# Patient Record
Sex: Female | Born: 1960 | Race: White | Hispanic: No | Smoking: Former smoker
Health system: Southern US, Community
[De-identification: ages and names within clinical notes are randomized; demographics above are authoritative.]

## PROBLEM LIST (undated history)

## (undated) DIAGNOSIS — D497 Neoplasm of unspecified behavior of endocrine glands and other parts of nervous system: Secondary | ICD-10-CM

## (undated) DIAGNOSIS — E079 Disorder of thyroid, unspecified: Secondary | ICD-10-CM

## (undated) DIAGNOSIS — I10 Essential (primary) hypertension: Secondary | ICD-10-CM

## (undated) DIAGNOSIS — E119 Type 2 diabetes mellitus without complications: Secondary | ICD-10-CM

## (undated) DIAGNOSIS — Q251 Coarctation of aorta: Secondary | ICD-10-CM

## (undated) HISTORY — PX: AORTA SURGERY: SHX548

## (undated) HISTORY — PX: ABDOMINAL HYSTERECTOMY: SHX81

## (undated) HISTORY — PX: CHOLECYSTECTOMY: SHX55

---

## 1998-03-04 ENCOUNTER — Ambulatory Visit (HOSPITAL_COMMUNITY): Admission: RE | Admit: 1998-03-04 | Discharge: 1998-03-04 | Payer: Self-pay | Admitting: Gastroenterology

## 1999-01-21 ENCOUNTER — Other Ambulatory Visit: Admission: RE | Admit: 1999-01-21 | Discharge: 1999-01-21 | Payer: Self-pay | Admitting: Gynecology

## 1999-03-17 ENCOUNTER — Encounter: Admission: RE | Admit: 1999-03-17 | Discharge: 1999-06-15 | Payer: Self-pay | Admitting: Anesthesiology

## 1999-03-24 ENCOUNTER — Encounter: Admission: RE | Admit: 1999-03-24 | Discharge: 1999-06-22 | Payer: Self-pay | Admitting: Neurology

## 1999-05-21 ENCOUNTER — Emergency Department (HOSPITAL_COMMUNITY): Admission: EM | Admit: 1999-05-21 | Discharge: 1999-05-21 | Payer: Self-pay | Admitting: Emergency Medicine

## 1999-05-21 ENCOUNTER — Encounter: Payer: Self-pay | Admitting: Emergency Medicine

## 1999-06-30 ENCOUNTER — Encounter: Admission: RE | Admit: 1999-06-30 | Discharge: 1999-09-28 | Payer: Self-pay | Admitting: Neurology

## 1999-07-22 ENCOUNTER — Ambulatory Visit: Admission: RE | Admit: 1999-07-22 | Discharge: 1999-07-22 | Payer: Self-pay | Admitting: Cardiovascular Disease

## 1999-10-07 ENCOUNTER — Encounter: Payer: Self-pay | Admitting: General Surgery

## 1999-10-08 ENCOUNTER — Encounter: Payer: Self-pay | Admitting: General Surgery

## 1999-10-08 ENCOUNTER — Ambulatory Visit (HOSPITAL_COMMUNITY): Admission: RE | Admit: 1999-10-08 | Discharge: 1999-10-09 | Payer: Self-pay | Admitting: General Surgery

## 2000-12-07 ENCOUNTER — Encounter: Admission: RE | Admit: 2000-12-07 | Discharge: 2000-12-26 | Payer: Self-pay | Admitting: Specialist

## 2006-01-06 ENCOUNTER — Encounter: Admission: RE | Admit: 2006-01-06 | Discharge: 2006-01-06 | Payer: Self-pay | Admitting: Internal Medicine

## 2006-10-30 ENCOUNTER — Encounter: Admission: RE | Admit: 2006-10-30 | Discharge: 2006-10-30 | Payer: Self-pay | Admitting: Internal Medicine

## 2010-12-18 ENCOUNTER — Encounter: Payer: Self-pay | Admitting: Internal Medicine

## 2012-02-10 LAB — HM DEXA SCAN

## 2012-02-14 ENCOUNTER — Encounter: Payer: Self-pay | Admitting: Family

## 2013-02-04 ENCOUNTER — Emergency Department (HOSPITAL_BASED_OUTPATIENT_CLINIC_OR_DEPARTMENT_OTHER)
Admission: EM | Admit: 2013-02-04 | Discharge: 2013-02-04 | Disposition: A | Discharge status: Home / Self Care | Payer: Medicare Other | Attending: Emergency Medicine | Admitting: Emergency Medicine

## 2013-02-04 ENCOUNTER — Encounter (HOSPITAL_BASED_OUTPATIENT_CLINIC_OR_DEPARTMENT_OTHER): Payer: Self-pay

## 2013-02-04 DIAGNOSIS — Z79899 Other long term (current) drug therapy: Secondary | ICD-10-CM | POA: Insufficient documentation

## 2013-02-04 DIAGNOSIS — Z862 Personal history of diseases of the blood and blood-forming organs and certain disorders involving the immune mechanism: Secondary | ICD-10-CM | POA: Insufficient documentation

## 2013-02-04 DIAGNOSIS — Z87891 Personal history of nicotine dependence: Secondary | ICD-10-CM | POA: Insufficient documentation

## 2013-02-04 DIAGNOSIS — Z8639 Personal history of other endocrine, nutritional and metabolic disease: Secondary | ICD-10-CM | POA: Insufficient documentation

## 2013-02-04 DIAGNOSIS — J9801 Acute bronchospasm: Secondary | ICD-10-CM | POA: Insufficient documentation

## 2013-02-04 DIAGNOSIS — Z8679 Personal history of other diseases of the circulatory system: Secondary | ICD-10-CM | POA: Insufficient documentation

## 2013-02-04 DIAGNOSIS — I10 Essential (primary) hypertension: Secondary | ICD-10-CM | POA: Insufficient documentation

## 2013-02-04 DIAGNOSIS — E119 Type 2 diabetes mellitus without complications: Secondary | ICD-10-CM | POA: Insufficient documentation

## 2013-02-04 DIAGNOSIS — IMO0002 Reserved for concepts with insufficient information to code with codable children: Secondary | ICD-10-CM | POA: Insufficient documentation

## 2013-02-04 DIAGNOSIS — J9809 Other diseases of bronchus, not elsewhere classified: Secondary | ICD-10-CM

## 2013-02-04 HISTORY — DX: Type 2 diabetes mellitus without complications: E11.9

## 2013-02-04 HISTORY — DX: Essential (primary) hypertension: I10

## 2013-02-04 HISTORY — DX: Disorder of thyroid, unspecified: E07.9

## 2013-02-04 HISTORY — DX: Neoplasm of unspecified behavior of endocrine glands and other parts of nervous system: D49.7

## 2013-02-04 HISTORY — DX: Coarctation of aorta: Q25.1

## 2013-02-04 MED ORDER — ALBUTEROL SULFATE HFA 108 (90 BASE) MCG/ACT IN AERS: 2.0000 | INHALATION_SPRAY | RESPIRATORY_TRACT | Status: DC | PRN

## 2013-02-04 MED ORDER — ALBUTEROL SULFATE (5 MG/ML) 0.5% IN NEBU
INHALATION_SOLUTION | RESPIRATORY_TRACT | Status: AC
  Administered 2013-02-04: 5 mg
  Filled 2013-02-04: qty 1

## 2013-02-04 MED ORDER — PREDNISONE 20 MG PO TABS: ORAL_TABLET | ORAL | Status: DC

## 2013-02-04 MED ORDER — PANTOPRAZOLE SODIUM 20 MG PO TBEC: 20.0000 mg | DELAYED_RELEASE_TABLET | Freq: Every day | ORAL | Status: DC

## 2013-02-04 MED ORDER — ALBUTEROL SULFATE (5 MG/ML) 0.5% IN NEBU
5.0000 mg | INHALATION_SOLUTION | Freq: Once | RESPIRATORY_TRACT | Status: AC
  Administered 2013-02-04: 5 mg via RESPIRATORY_TRACT
  Filled 2013-02-04: qty 0.5

## 2013-02-04 MED ORDER — IPRATROPIUM BROMIDE 0.02 % IN SOLN
RESPIRATORY_TRACT | Status: AC
  Administered 2013-02-04: 0.5 mg
  Filled 2013-02-04: qty 2.5

## 2013-02-04 MED ORDER — PREDNISONE 50 MG PO TABS
60.0000 mg | ORAL_TABLET | Freq: Once | ORAL | Status: AC
  Administered 2013-02-04: 60 mg via ORAL
  Filled 2013-02-04: qty 1

## 2013-02-04 NOTE — ED Provider Notes (Signed)
History  This chart was scribed for Hurman Horn, MD by Ardeen Jourdain, ED Scribe. This patient was seen in room MH07/MH07 and the patient's care was started at 1816.  CSN: [id number]  Arrival date & time 02/04/13  1704   First MD Initiated Contact with Patient 02/04/13 1816      Chief Complaint  Patient presents with  . Shortness of Breath     The history is provided by the patient. No language interpreter was used.    Monica Peterson is a 52 y.o. female with a h/o aorta coarctation, HTN and DM who presents to the Emergency Department complaining of gradually worsening, constant SOB that began 1.5 months ago with associated productive cough and post tussive emesis. She states she has been prescribed 3 rounds of steroids, 3 rounds of antibiotics and an inhaler continuously with no relief. She states she had a negative pertussis when she was seen at [facility] last week. Pt denies fever, neck pain, confusion, hallucinations, CP, abdominal pain, nausea, diarrhea, back pain, HA, weakness, numbness and rash as associated symptoms. Pt states she is not an everyday smoker.     Past Medical History  Diagnosis Date  . Aorta coarctation   . Thyroid disease   . Diabetes mellitus without complication   . Pituitary tumor   . Hypertension     Past Surgical History  Procedure Laterality Date  . Aorta surgery    . Abdominal hysterectomy    . Cholecystectomy    . Cesarean section      No family history on file.  History  Substance Use Topics  . Smoking status: Former Games developer  . Smokeless tobacco: Not on file  . Alcohol Use: Yes   No OB history available.   Review of Systems  10 Systems reviewed and are negative for acute change except as noted in the HPI.  Allergies  Codeine; Topamax; Celexa; Serzone; and Trazodone and nefazodone  Home Medications   Current Outpatient Rx  Name  Route  Sig  Dispense  Refill  . acetaminophen-codeine (TYLENOL #3) 300-30 MG per tablet   Oral   Take 1  tablet by mouth every 4 (four) hours as needed for pain.         Marland Kitchen albuterol (PROVENTIL HFA;VENTOLIN HFA) 108 (90 BASE) MCG/ACT inhaler   Inhalation   Inhale 2 puffs into the lungs every 6 (six) hours as needed for wheezing.         Marland Kitchen ALPRAZolam (XANAX) 0.5 MG tablet   Oral   Take 0.5 mg by mouth at bedtime as needed for sleep.         Marland Kitchen azithromycin (ZITHROMAX Z-PAK) 250 MG tablet   Oral   Take 250 mg by mouth daily.         . cabergoline (DOSTINEX) 0.5 MG tablet   Oral   Take 0.5 mg by mouth 2 (two) times a week.         . cyclobenzaprine (FLEXERIL) 10 MG tablet   Oral   Take 10 mg by mouth 3 (three) times daily as needed for muscle spasms.         . fluticasone (FLONASE) 50 MCG/ACT nasal spray   Nasal   Place 2 sprays into the nose daily.         . furosemide (LASIX) 40 MG tablet   Oral   Take 40 mg by mouth daily.         Marland Kitchen guaiFENesin (ROBITUSSIN) 100 MG/5ML liquid  Oral   Take 200 mg by mouth 4 (four) times daily as needed for cough (Pt states only taken one dose today.).         Marland Kitchen ibuprofen (ADVIL,MOTRIN) 800 MG tablet   Oral   Take 800 mg by mouth every 8 (eight) hours as needed for pain.         Marland Kitchen levothyroxine (SYNTHROID, LEVOTHROID) 88 MCG tablet   Oral   Take 88 mcg by mouth daily.         . metoprolol succinate (TOPROL-XL) 25 MG 24 hr tablet   Oral   Take 25 mg by mouth daily.         . Olopatadine HCl (PATADAY) 0.2 % SOLN   Ophthalmic   Apply to eye.         . promethazine (PHENERGAN) 12.5 MG tablet   Oral   Take 12.5 mg by mouth every 6 (six) hours as needed for nausea.         . ranitidine (ZANTAC) 150 MG tablet   Oral   Take 300 mg by mouth 2 (two) times daily.         . traMADol (ULTRAM) 50 MG tablet   Oral   Take 50 mg by mouth every 6 (six) hours as needed for pain.         Marland Kitchen albuterol (PROVENTIL HFA;VENTOLIN HFA) 108 (90 BASE) MCG/ACT inhaler   Inhalation   Inhale 2 puffs into the lungs every 2 (two)  hours as needed for wheezing or shortness of breath (cough).   1 Inhaler   0   . pantoprazole (PROTONIX) 20 MG tablet   Oral   Take 1 tablet (20 mg total) by mouth daily.   30 tablet   0   . predniSONE (DELTASONE) 20 MG tablet      3 tabs po daily x 3 days, then 2 tabs x 3 days, then 1.5 tabs x 3 days, then 1 tab x 3 days, then 0.5 tabs x 3 days   27 tablet   0     Triage Vitals: BP 157/104  Pulse 90  Temp(Src) 98.2 F (36.8 C) (Oral)  Resp 20  SpO2 97%  Physical Exam  Nursing note and vitals reviewed. Constitutional: She is oriented to person, place, and time. She appears well-developed and well-nourished. No distress.  Awake, alert, nontoxic appearance. Obese  HENT:  Head: Normocephalic and atraumatic.  Eyes: Right eye exhibits no discharge. Left eye exhibits no discharge.  Neck: Neck supple.  Cardiovascular: Normal rate, regular rhythm and normal heart sounds.  Exam reveals no gallop and no friction rub.   No murmur heard. Pulmonary/Chest: Effort normal. No respiratory distress. She has wheezes. She has no rales. She exhibits tenderness.  Chest wall mildly tender, diffuse wheezes bilaterally   Abdominal: Soft. Bowel sounds are normal. There is no tenderness. There is no rebound.  Musculoskeletal: She exhibits no tenderness.  Baseline ROM, no obvious new focal weakness.  Neurological: She is alert and oriented to person, place, and time.  Mental status and motor strength appears baseline for patient and situation.  Skin: Skin is warm and dry. No rash noted. She is not diaphoretic.  Psychiatric: She has a normal mood and affect. Her behavior is normal.    ED Course  Procedures (including critical care time)  DIAGNOSTIC STUDIES: Oxygen Saturation is 97% on room air, normal by my interpretation.    COORDINATION OF CARE:  6:24 PM: Patient / Family / Caregiver  informed of clinical course, understand medical decision-making process, and agree with plan.   Labs  Reviewed - No data to display No results found.   1. Recurrent bronchospasm       MDM  I doubt any other EMC precluding discharge at this time including, but not necessarily limited to the following:sepsis.  I personally performed the services described in this documentation, which was scribed in my presence. The recorded information has been reviewed and is accurate.     Hurman Horn, MD 02/06/13 2312

## 2013-02-04 NOTE — ED Notes (Signed)
C/o sob x 1 .5 months-prod cough x 2 weeks-was seen at [facility] last week with negative pertussis-started on abx

## 2013-02-18 ENCOUNTER — Ambulatory Visit (INDEPENDENT_AMBULATORY_CARE_PROVIDER_SITE_OTHER): Payer: Medicare Other | Admitting: Internal Medicine

## 2013-02-18 ENCOUNTER — Encounter: Payer: Self-pay | Admitting: Internal Medicine

## 2013-02-18 VITALS — BP 160/100 | HR 90 | Temp 97.1°F | Ht 62.0 in | Wt 251.2 lb

## 2013-02-18 DIAGNOSIS — R05 Cough: Secondary | ICD-10-CM

## 2013-02-18 DIAGNOSIS — R059 Cough, unspecified: Secondary | ICD-10-CM

## 2013-02-18 MED ORDER — PREDNISONE (PAK) 10 MG PO TABS: ORAL_TABLET | ORAL | Status: DC

## 2013-02-18 MED ORDER — PANTOPRAZOLE SODIUM 40 MG PO TBEC: 40.0000 mg | DELAYED_RELEASE_TABLET | Freq: Every day | ORAL | Status: DC

## 2013-02-18 MED ORDER — RANITIDINE HCL 150 MG PO TABS: ORAL_TABLET | ORAL | Status: AC

## 2013-02-18 NOTE — Patient Instructions (Addendum)
The key to effective treatment for your cough is eliminating the non-stop cycle of cough you're stuck in long enough to let your airway heal completely and then see if there is anything still making you cough once you stop the cough suppression, but this should take no more than 5 days to figuure out  First take delsym two tsp every 12 hours and supplement if needed with  tramadol 50 mg up to 2 every 4 hours to suppress the urge to cough. Swallowing water or using ice chips/non mint and menthol containing candies (such as lifesavers or sugarless jolly ranchers) are also effective.  You should rest your voice and avoid activities that you know make you cough.  Once you have eliminated the cough for 3 straight days try reducing the tramadol first,  then the delsym as tolerated.    Try pantoprazole 40 Take 30-60 min before first meal of the day and Zantac 150 x 2  At  bedtime until cough is completely gone for at least a week without the need for cough suppression  I think of reflux for chronic cough like I do oxygen for fire (doesn't cause the fire but once you get the oxygen suppressed it usually goes away regardless of the exact cause).  GERD (REFLUX)  is an extremely common cause of respiratory symptoms, many times with no significant heartburn at all.    It can be treated with medication, but also with lifestyle changes including avoidance of late meals, excessive alcohol, smoking cessation, and avoid fatty foods, chocolate, peppermint, colas, red wine, and acidic juices such as orange juice.  NO MINT OR MENTHOL PRODUCTS SO NO COUGH DROPS  USE SUGARLESS CANDY INSTEAD (jolley ranchers or Stover's)  NO OIL BASED VITAMINS - use powdered substitutes.    Prednisone 10 mg take  4 each am x 2 days,   2 each am x 2 days,  1 each am x2days and stop

## 2013-02-18 NOTE — Progress Notes (Deleted)
  Subjective:    Patient ID: Monica Peterson, female    DOB: 1961/10/02, 52 y.o.   MRN: [id number]  HPI    Review of Systems  Constitutional: Negative for fever, chills and unexpected weight change.  HENT: Negative for ear pain, nosebleeds, congestion, sore throat, rhinorrhea, sneezing, trouble swallowing, dental problem, voice change, postnasal drip and sinus pressure.   Eyes: Negative for visual disturbance.  Respiratory: Negative for cough, choking and shortness of breath.   Cardiovascular: Negative for chest pain and leg swelling.  Gastrointestinal: Negative for vomiting, abdominal pain and diarrhea.  Genitourinary: Negative for difficulty urinating.  Musculoskeletal: Negative for arthralgias.  Skin: Negative for rash.  Neurological: Negative for tremors, syncope and headaches.  Hematological: Does not bruise/bleed easily.       Objective:   Physical Exam        Assessment & Plan:

## 2013-02-18 NOTE — Progress Notes (Signed)
  Subjective:    Patient ID: Monica Peterson, female    DOB: Oct 11, 1961   MRN: [id number]  HPI  51 yowf no significant history of smoking but onset pnds dx as seasonal  rhinitis 2012  then sinus surgery in [place] deviated septum then cough started late Jan 2014 assoc ? Sinus infection > persistent cough referred 02/18/2013 to pulmonary clinic.  02/18/2013 1st pulmonary ov cc s/p 5 ov's persistent cough > dry, hoarse, sob assoc with throat pain and fullness, prednisone helps the most but not the  last round,  proaire eases some.  protonix started 3/10 but takes p breakfast. Has been to [facility] ER / ct chest > neg  Mucus was green , no more  Sinus fill up toward end of day but discharge clear Sleeps on 2 pillows  Not sob unless coughing  No obvious daytime variabilty or assoc chronic  cp or chest tightness, subjective wheeze overt sinus or hb symptoms. No unusual exp hx or h/o childhood pna/ asthma or premature birth to her knowledge.   Sleeping ok without nocturnal  or early am exacerbation  of respiratory  c/o's or need for noct saba. Also denies any obvious fluctuation of symptoms with weather or environmental changes or other aggravating or alleviating factors except as outlined above   ROS  The following are not active complaints unless bolded sore throat, dysphagia, dental problems, itching, sneezing,  nasal congestion or excess/ purulent secretions, ear ache,   fever, chills, sweats, unintended wt loss, pleuritic or exertional cp, hemoptysis,  orthopnea pnd or leg swelling chronic dep minimal  presyncope, palpitations, heartburn, abdominal pain, anorexia, nausea, vomiting, diarrhea  or change in bowel or urinary habits, change in stools or urine, dysuria,hematuria,  rash, arthralgias, visual complaints, headache, numbness weakness or ataxia or problems with walking or coordination,  change in mood/affect or memory.       Review of Systems  HENT: Positive for ear pain, sore throat, trouble  swallowing and dental problem.   Respiratory: Positive for cough and shortness of breath.   Cardiovascular: Positive for leg swelling.  Musculoskeletal: Positive for joint swelling.  Psychiatric/Behavioral: The patient is nervous/anxious.        Objective:   Physical Exam  amb hoarse wf nad  HEENT: nl dentition, turbinates, and orophanx. Nl external ear canals without cough reflex   NECK :  without JVD/Nodes/TM/ nl carotid upstrokes bilaterally   LUNGS: no acc muscle use, clear to A and P bilaterally without cough on insp or exp maneuvers   CV:  RRR  no s3 or murmur or increase in P2, no edema   ABD:  soft and nontender with nl excursion in the supine position. No bruits or organomegaly, bowel sounds nl  MS:  warm without deformities, calf tenderness, cyanosis or clubbing  SKIN: warm and dry without lesions    NEURO:  alert, approp, no deficits  Ct report from [facility] neg PE          Assessment & Plan:

## 2013-02-20 DIAGNOSIS — R05 Cough: Secondary | ICD-10-CM | POA: Insufficient documentation

## 2013-02-20 DIAGNOSIS — R059 Cough, unspecified: Secondary | ICD-10-CM | POA: Insufficient documentation

## 2013-02-20 NOTE — Assessment & Plan Note (Signed)
The most common causes of chronic cough in immunocompetent adults include the following: upper airway cough syndrome (UACS), previously referred to as postnasal drip syndrome (PNDS), which is caused by variety of rhinosinus conditions; (2) asthma; (3) GERD; (4) chronic bronchitis from cigarette smoking or other inhaled environmental irritants; (5) nonasthmatic eosinophilic bronchitis; and (6) bronchiectasis.   These conditions, singly or in combination, have accounted for up to 94% of the causes of chronic cough in prospective studies.   Other conditions have constituted no >6% of the causes in prospective studies These have included bronchogenic carcinoma, chronic interstitial pneumonia, sarcoidosis, left ventricular failure, ACEI-induced cough, and aspiration from a condition associated with pharyngeal dysfunction.    Chronic cough is often simultaneously caused by more than one condition. A single cause has been found from 38 to 82% of the time, multiple causes from 18 to 62%. Multiply caused cough has been the result of three diseases up to 42% of the time.       Most likely this is  Classic Upper airway cough syndrome, so named because it's frequently impossible to sort out how much is  CR/sinusitis with freq throat clearing (which can be related to primary GERD)   vs  causing  secondary (" extra esophageal")  GERD from wide swings in gastric pressure that occur with throat clearing, often  promoting Etcheverry use of mint and menthol lozenges that reduce the lower esophageal sphincter tone and exacerbate the problem further in a cyclical fashion.   These are the same pts (now being labeled as having "irritable larynx syndrome" by some cough centers) who not infrequently have a history of having failed to tolerate ace inhibitors,  dry powder inhalers or biphosphonates or report having atypical reflux symptoms that don't respond to standard doses of PPI , and are easily confused as having aecopd or asthma  flares by even experienced allergists/ pulmonologists.   For now max rx for gerd/ cyclical cough then regroup  See instructions for specific recommendations which were reviewed directly with the patient who was given a copy with highlighter outlining the key components.

## 2013-02-22 ENCOUNTER — Institutional Professional Consult (permissible substitution): Payer: Medicare Other | Admitting: Internal Medicine

## 2013-03-04 ENCOUNTER — Telehealth: Payer: Self-pay | Admitting: Internal Medicine

## 2013-03-04 NOTE — Telephone Encounter (Signed)
Spoke with the pt and she is asking for an appt. She states her cough has returned. Appt made for Thursday at 10am.Deloss Amico [place], [place]

## 2013-03-07 ENCOUNTER — Ambulatory Visit (INDEPENDENT_AMBULATORY_CARE_PROVIDER_SITE_OTHER): Payer: Medicare Other | Admitting: Internal Medicine

## 2013-03-07 ENCOUNTER — Encounter: Payer: Self-pay | Admitting: Internal Medicine

## 2013-03-07 VITALS — BP 142/86 | HR 95 | Temp 98.2°F | Ht 61.0 in | Wt 254.4 lb

## 2013-03-07 DIAGNOSIS — R059 Cough, unspecified: Secondary | ICD-10-CM

## 2013-03-07 DIAGNOSIS — R05 Cough: Secondary | ICD-10-CM

## 2013-03-07 DIAGNOSIS — I10 Essential (primary) hypertension: Secondary | ICD-10-CM | POA: Insufficient documentation

## 2013-03-07 MED ORDER — TRAMADOL HCL 50 MG PO TABS: 50.0000 mg | ORAL_TABLET | Freq: Four times a day (QID) | ORAL | Status: DC | PRN

## 2013-03-07 MED ORDER — NEBIVOLOL HCL 5 MG PO TABS: 5.0000 mg | ORAL_TABLET | Freq: Every day | ORAL | Status: AC

## 2013-03-07 MED ORDER — PREDNISONE (PAK) 10 MG PO TABS: ORAL_TABLET | ORAL | Status: DC

## 2013-03-07 NOTE — Progress Notes (Signed)
Subjective:    Patient ID: Monica Peterson, female    DOB: 1961-06-11   MRN: [id number]  HPI  51 yowf no significant history of smoking but onset pnds dx as seasonal  rhinitis 2012  then sinus surgery in [place] deviated septum then cough started late Jan 2014 assoc ? Sinus infection > persistent cough referred 02/18/2013 to pulmonary clinic.  02/18/2013 1st pulmonary ov cc s/p 5 ov's persistent cough > dry, hoarse, sob assoc with throat pain and fullness, prednisone helps the most but not the  last round,  proaire eases some.  protonix started 3/10 but takes p breakfast. Has been to [facility] ER / ct chest > neg  Mucus was green , no more  Sinus fill up toward end of day but discharge clear Sleeps on 2 pillows  Not sob unless coughing rec First take delsym two tsp every 12 hours and supplement if needed with  tramadol 50 mg up to 2 every 4 hours to suppress the urge to cough. .   Try pantoprazole 40 Take 30-60 min before first meal of the day and Zantac 150 x 2  At  bedtime until cough is completely gone for at least a week without the need for cough suppression GERD diet Prednisone 10 mg take  4 each am x 2 days,   2 each am x 2 days,  1 each am x2days and stop    03/07/2013 f/u ov/Monica Peterson f/u atypical asthma/ ? vcd Chief Complaint  Patient presents with  . Acute Visit    Pt c/o increased cough and SOB x 1 wk. Cough is prod with minimal green sputum. She gets SOB with minimal exertion such as walking from parking lot to building today.   not even 50% better at peak but did not take meds correctly. Seeing ent 4/11.  When improved noted decrease need for albuterol but back up again.  Overall did improve much better p last ov here than all the other ov's prior including to [facility] er  No obvious daytime variabilty or assoc chronic  cp or chest tightness, subjective wheeze overt sinus or hb symptoms. No unusual exp hx or h/o childhood pna/ asthma or premature birth to her knowledge.    Sleeping ok without nocturnal  or early am exacerbation  of respiratory  c/o's or need for noct saba. Also denies any obvious fluctuation of symptoms with weather or environmental changes or other aggravating or alleviating factors except as outlined above   ROS  The following are not active complaints unless bolded sore throat, dysphagia, dental problems, itching, sneezing,  nasal congestion or excess/ purulent secretions, ear ache,   fever, chills, sweats, unintended wt loss, pleuritic or exertional cp, hemoptysis,  orthopnea pnd or leg swelling chronic dep minimal  presyncope, palpitations, heartburn, abdominal pain, anorexia, nausea, vomiting, diarrhea  or change in bowel or urinary habits, change in stools or urine, dysuria,hematuria,  rash, arthralgias, visual complaints, headache, numbness weakness or ataxia or problems with walking or coordination,  change in mood/affect or memory.           Objective:   Physical Exam  amb hoarse wf nad  HEENT: nl dentition, turbinates, and orophanx. Nl external ear canals without cough reflex   NECK :  without JVD/Nodes/TM/ nl carotid upstrokes bilaterally   LUNGS: no acc muscle use, clear to A and P bilaterally without cough on insp or exp maneuvers   CV:  RRR  no s3 or murmur or increase in P2,  no edema   ABD:  soft and nontender with nl excursion in the supine position. No bruits or organomegaly, bowel sounds nl  MS:  warm without deformities, calf tenderness, cyanosis or clubbing  SKIN: warm and dry without lesions    NEURO:  alert, approp, no deficits  Ct report from [facility] neg PE          Assessment & Plan:

## 2013-03-07 NOTE — Patient Instructions (Addendum)
The key to effective treatment for your cough is eliminating the non-stop cycle of cough you're stuck in long enough to let your airway heal completely and then see if there is anything still making you cough once you stop the cough suppression, but this should take no more than 5 days to figuure out  First take delsym two tsp every 12 hours and supplement if needed with  tramadol 50 mg up to 2 every 4 hours to suppress the urge to cough. Swallowing water or using ice chips/non mint and menthol containing candies (such as lifesavers or sugarless jolly ranchers) are also effective.  You should rest your voice and avoid activities that you know make you cough.  Once you have eliminated the cough for 3 straight days try reducing the tramadol first,  then the delsym as tolerated.    Try pantoprazole 40 Take 30-60 min before first meal of the day and Zantac 150 x 2  At  bedtime until cough is completely gone for at least a week without the need for cough suppression  I think of reflux for chronic cough like I do oxygen for fire (doesn't cause the fire but once you get the oxygen suppressed it usually goes away regardless of the exact cause).  GERD (REFLUX)  is an extremely common cause of respiratory symptoms, many times with no significant heartburn at all.    It can be treated with medication, but also with lifestyle changes including avoidance of late meals, excessive alcohol, smoking cessation, and avoid fatty foods, chocolate, peppermint, colas, red wine, and acidic juices such as orange juice.  NO MINT OR MENTHOL PRODUCTS SO NO COUGH DROPS  USE SUGARLESS CANDY INSTEAD (jolley ranchers or Stover's)  NO OIL BASED VITAMINS - use powdered substitutes.   Prednisone 10 mg take  4 each am x 2 days,   2 each am x 2 days,  1 each am x2days and stop    Stop toprol (metaprolol) and start bystolic 5 mg one daily  Take 2 puffs first thing in am and then another 2 puffs about 12 hours later.

## 2013-03-07 NOTE — Assessment & Plan Note (Signed)
Continue to strongly feel this is mostly  Classic Upper airway cough syndrome, so named because it's frequently impossible to sort out how much is  CR/sinusitis with freq throat clearing (which can be related to primary GERD)   vs  causing  secondary (" extra esophageal")  GERD from wide swings in gastric pressure that occur with throat clearing, often  promoting Grillot use of mint and menthol lozenges that reduce the lower esophageal sphincter tone and exacerbate the problem further in a cyclical fashion.   These are the same pts (now being labeled as having "irritable larynx syndrome" by some cough centers) who not infrequently have a history of having failed to tolerate ace inhibitors,  dry powder inhalers or biphosphonates or report having atypical reflux symptoms that don't respond to standard doses of PPI , and are easily confused as having aecopd or asthma flares by even experienced allergists/ pulmonologists.   I had an extended discussion with the patient today lasting 15 to 20 minutes of a 25 minute visit on the following issues:  The standardized cough guidelines published in Chest by [place] in 2006 are still the best available and consist of a multiple step process (up to 12!) , not a single office visit,  and are intended  to address this problem logically,  with an alogrithm dependent on response to empiric treatment at  each progressive step  to determine a specific diagnosis with  minimal addtional testing needed. Therefore if adherence is an issue or can't be accurately verified,  it's very unlikely the standard evaluation and treatment will be successful here.    Furthermore, response to therapy (other than acute cough suppression, which should only be used short term with avoidance of narcotic containing cough syrups if possible), can be a gradual process for which the patient may perceive immediate benefit.  Unlike going to an eye doctor where the best perscription is almost always  the first one and is immediately effective, this is almost never the case in the management of chronic cough syndromes. Therefore the patient needs to commit up front to consistently adhere to recommendations  for up to 6 weeks of therapy directed at the likely underlying problem(s) before the response can be reasonably evaluated.   Will repeat step one then regroup in 4 weeks

## 2013-03-07 NOTE — Assessment & Plan Note (Signed)
Strongly prefer in this setting: Bystolic, the most beta -1  selective Beta blocker available in sample form, with bisoprolol the most selective generic choice  on the market.  

## 2013-03-20 ENCOUNTER — Ambulatory Visit: Payer: Medicare Other | Admitting: Internal Medicine

## 2013-03-21 ENCOUNTER — Encounter: Payer: Self-pay | Admitting: Internal Medicine

## 2013-03-21 ENCOUNTER — Ambulatory Visit (INDEPENDENT_AMBULATORY_CARE_PROVIDER_SITE_OTHER): Payer: Medicare Other | Admitting: Internal Medicine

## 2013-03-21 VITALS — BP 142/86 | HR 78 | Temp 97.7°F | Ht 62.0 in | Wt 251.0 lb

## 2013-03-21 DIAGNOSIS — R059 Cough, unspecified: Secondary | ICD-10-CM

## 2013-03-21 DIAGNOSIS — R05 Cough: Secondary | ICD-10-CM

## 2013-03-21 DIAGNOSIS — I10 Essential (primary) hypertension: Secondary | ICD-10-CM

## 2013-03-21 MED ORDER — PREDNISONE (PAK) 10 MG PO TABS: ORAL_TABLET | ORAL | Status: AC

## 2013-03-21 MED ORDER — TRAMADOL HCL 50 MG PO TABS
50.0000 mg | ORAL_TABLET | Freq: Four times a day (QID) | ORAL | Status: AC | PRN
Start: 2013-03-21 — End: ?

## 2013-03-21 MED ORDER — GABAPENTIN 100 MG PO CAPS: 100.0000 mg | ORAL_CAPSULE | Freq: Three times a day (TID) | ORAL | Status: AC

## 2013-03-21 MED ORDER — MEPERIDINE HCL 50 MG PO TABS: 50.0000 mg | ORAL_TABLET | ORAL | Status: AC | PRN

## 2013-03-21 NOTE — Patient Instructions (Addendum)
The key to effective treatment for your cough is eliminating the non-stop cycle of cough you're stuck in long enough to let your airway heal completely and then see if there is anything still making you cough once you stop the cough suppression, but this should take no more than 5 days to figuure out  First take delsym two tsp every 12 hours and supplement if needed with  tramadol 50 mg up to 2 every 4 hours supplemented with demerol 50 mg 1 every when you can afford to be sleepy to suppress the urge to cough. Swallowing water or using ice chips/non mint and menthol containing candies (such as lifesavers or sugarless jolly ranchers) are also effective.  You should rest your voice and avoid activities that you know make you cough.  Once you have eliminated the cough for 3 straight days try reducing the demerol first, then  tramadol ,  then the delsym as tolerated.    Try Protonix Take 30-60 min before first meal of the day and Zantac 150 x 2  At  bedtime until cough is completely gone for at least a week without the need for cough suppression  I think of reflux for chronic cough like I do oxygen for fire (doesn't cause the fire but once you get the oxygen suppressed it usually goes away regardless of the exact cause).  GERD (REFLUX)  is an extremely common cause of respiratory symptoms, many times with no significant heartburn at all.    It can be treated with medication, but also with lifestyle changes including avoidance of late meals, excessive alcohol, smoking cessation, and avoid fatty foods, chocolate, peppermint, colas, red wine, and acidic juices such as orange juice.  NO MINT OR MENTHOL PRODUCTS SO NO COUGH DROPS  USE SUGARLESS CANDY INSTEAD (jolley ranchers or Stover's)  NO OIL BASED VITAMINS - use powdered substitutes.    Prednisone 10 mg take  4 each am x 3 days,  3 x 3 days,  2 each am x 3 days,  1 each am x 3 days and stop    Neurontin 100 mg three times until return  Whenever you  cough, please cough into the flutter valve.  Please schedule a follow up office visit in 3 weeks, sooner if needed and bring all medications with you.

## 2013-03-21 NOTE — Progress Notes (Signed)
Subjective:    Patient ID: Monica Peterson, female    DOB: Jun 10, 1961   MRN: [id number]  HPI  51 yowf no significant history of smoking but onset pnds dx as seasonal  rhinitis 2012  then sinus surgery in [place] deviated septum then cough started late Jan 2014 assoc ? Sinus infection > persistent cough referred 02/18/2013 to pulmonary clinic.  02/18/2013 1st pulmonary ov cc s/p 5 ov's persistent cough > dry, hoarse, sob assoc with throat pain and fullness, prednisone helps the most but not the  last round,  proaire eases some.  protonix started 3/10 but takes p breakfast. Has been to [facility] / ct chest > neg  Mucus was green , no more  Sinus fill up toward end of day but discharge clear Sleeps on 2 pillows  Not sob unless coughing rec First take delsym two tsp every 12 hours and supplement if needed with  tramadol 50 mg up to 2 every 4 hours to suppress the urge to cough. .   Try pantoprazole 40 Take 30-60 min before first meal of the day and Zantac 150 x 2  At  bedtime until cough is completely gone for at least a week without the need for cough suppression GERD diet Prednisone 10 mg take  4 each am x 2 days,   2 each am x 2 days,  1 each am x2days and stop    03/07/2013 f/u ov/Shawon Denzer f/u atypical asthma/ ? vcd Chief Complaint  Patient presents with  . Acute Visit    Pt c/o increased cough and SOB x 1 wk. Cough is prod with minimal green sputum. She gets SOB with minimal exertion such as walking from parking lot to building today.   not even 50% better at peak but did not take meds correctly. Seeing ent 4/11.  When improved noted decrease need for albuterol but back up again.  Overall did improve much better p last ov here than all the other ov's prior including to [facility] er rec First take delsym two tsp every 12 hours and supplement if needed with  tramadol 50 mg up to 2 every 4 hours to suppress the urge to cough. Swallowing water or using ice chips/non mint and menthol containing  candies (such as lifesavers or sugarless jolly ranchers) are also effective.  You should rest your voice and avoid activities that you know make you cough. Once you have eliminated the cough for 3 straight days try reducing the tramadol first,  then the delsym as tolerated.   Try pantoprazole 40 Take 30-60 min before first meal of the day and Zantac 150 x 2  At  bedtime until cough is completely gone for at least a week without the need for cough suppression I think of reflux for chronic cough like I do oxygen for fire (doesn't cause the fire but once you get the oxygen suppressed it usually goes away regardless of the exact cause). GERD  Prednisone 10 mg take  4 each am x 2 days,   2 each am x 2 days,  1 each am x2days and stop   Stop toprol (metaprolol) and start bystolic 5 mg one daily     03/21/2013 f/u ov/Elyon Zoll never took more than 6 tramadol in 24 h, now on clindamycin from ent Chief Complaint  Patient presents with  . Follow-up    Cough is no better since last visit, still coughing up large amounts of green sputum- worse at night. Breathing is also  no better.   did get about short term benefit then sev days after prednisone, coughing so hard gagging and choking and sob at rest and ent felt shw was wheezing but minimally better from albuterol  No obvious daytime variabilty or assoc chronic  cp or chest tightness, subjective wheeze overt sinus or hb symptoms. No unusual exp hx or h/o childhood pna/ asthma or premature birth to her knowledge.   Sleeping ok without nocturnal  or early am exacerbation  of respiratory  c/o's or need for noct saba. Also denies any obvious fluctuation of symptoms with weather or environmental changes or other aggravating or alleviating factors except as outlined above   ROS  The following are not active complaints unless bolded sore throat, dysphagia, dental problems, itching, sneezing,  nasal congestion or excess/ purulent secretions, ear ache,   fever, chills,  sweats, unintended wt loss, pleuritic or exertional cp, hemoptysis,  orthopnea pnd or leg swelling chronic dep minimal  presyncope, palpitations, heartburn, abdominal pain, anorexia, nausea, vomiting, diarrhea  or change in bowel or urinary habits, change in stools or urine, dysuria,hematuria,  rash, arthralgias, visual complaints, headache, numbness weakness or ataxia or problems with walking or coordination,  change in mood/affect or memory.           Objective:   Physical Exam  amb hoarse wf nad with harsh  Barking cough, prominent pseudowheeze  Wt Readings from Last 3 Encounters:  03/21/13 251 lb (113.853 kg)  03/07/13 254 lb 6.4 oz (115.395 kg)  02/18/13 251 lb 3.2 oz (113.944 kg)     HEENT: nl dentition, turbinates, and orophanx. Nl external ear canals without cough reflex   NECK :  without JVD/Nodes/TM/ nl carotid upstrokes bilaterally   LUNGS: no acc muscle use, clear to A and P bilaterally without cough on insp or exp maneuvers   CV:  RRR  no s3 or murmur or increase in P2, no edema   ABD:  soft and nontender with nl excursion in the supine position. No bruits or organomegaly, bowel sounds nl  MS:  warm without deformities, calf tenderness, cyanosis or clubbing  SKIN: warm and dry without lesions         Ct report from [facility] neg PE          Assessment & Plan:

## 2013-03-22 ENCOUNTER — Encounter: Payer: Self-pay | Admitting: Internal Medicine

## 2013-03-22 ENCOUNTER — Telehealth: Payer: Self-pay | Admitting: Internal Medicine

## 2013-03-22 MED ORDER — OMEPRAZOLE 40 MG PO CPDR: 40.0000 mg | DELAYED_RELEASE_CAPSULE | Freq: Every day | ORAL | Status: AC

## 2013-03-22 NOTE — Telephone Encounter (Signed)
Called and spoke with pt and she is aware of MW recs to change the protonix to omeprazole 40 mg  Daily   30-60 mins before the first meal of the day.  Pt is aware of Rx sent to her pharmacy and med list has been update.

## 2013-03-22 NOTE — Assessment & Plan Note (Signed)
Adequate control on present rx, reviewed  

## 2013-03-22 NOTE — Telephone Encounter (Signed)
Omeprazole 40 Take 30-60 min before first meal of the day  

## 2013-03-22 NOTE — Telephone Encounter (Signed)
Pt insurance does not cover protonix. Per Pharmacy they think they will cover omeprazole. Please advise if ok to change protonix to omeprazole or do we need to do PA? Please advise on directions and strength. Thanks. Carron Curie, CMA Allergies  Allergen Reactions  . Codeine Nausea Only  . Topamax (Topiramate) Other (See Comments)    Abnormal behavior  . Celexa (Citalopram Hydrobromide) Rash  . Serzone (Nefazodone) Rash  . Trazodone And Nefazodone Rash

## 2013-03-22 NOTE — Assessment & Plan Note (Addendum)
Still strongly suspect  Classic Upper airway cough syndrome, so named because it's frequently impossible to sort out how much is  CR/sinusitis with freq throat clearing (which can be related to primary GERD)   vs  causing  secondary (" extra esophageal")  GERD from wide swings in gastric pressure that occur with throat clearing, often  promoting Majewski use of mint and menthol lozenges that reduce the lower esophageal sphincter tone and exacerbate the problem further in a cyclical fashion.   These are the same pts (now being labeled as having "irritable larynx syndrome" by some cough centers) who not infrequently have a history of having failed to tolerate ace inhibitors,  dry powder inhalers or biphosphonates or report having atypical reflux symptoms that don't respond to standard doses of PPI , and are easily confused as having aecopd or asthma flares by even experienced allergists/ pulmonologists.   We still have not been able to eliminate cyclical coughing so will add short course of demerol and add neurotin and consider adding also short course of reglan for non acid gerd next ov if not improving

## 2013-04-11 ENCOUNTER — Ambulatory Visit: Payer: Medicare Other | Admitting: Internal Medicine

## 2013-04-19 ENCOUNTER — Emergency Department (HOSPITAL_BASED_OUTPATIENT_CLINIC_OR_DEPARTMENT_OTHER): Payer: Medicare Other

## 2013-04-19 ENCOUNTER — Encounter (HOSPITAL_BASED_OUTPATIENT_CLINIC_OR_DEPARTMENT_OTHER): Payer: Self-pay | Admitting: *Deleted

## 2013-04-19 ENCOUNTER — Emergency Department (HOSPITAL_BASED_OUTPATIENT_CLINIC_OR_DEPARTMENT_OTHER)
Admission: EM | Admit: 2013-04-19 | Discharge: 2013-04-19 | Disposition: A | Discharge status: Home / Self Care | Payer: Medicare Other | Attending: Emergency Medicine | Admitting: Emergency Medicine

## 2013-04-19 DIAGNOSIS — Y929 Unspecified place or not applicable: Secondary | ICD-10-CM | POA: Insufficient documentation

## 2013-04-19 DIAGNOSIS — Z87891 Personal history of nicotine dependence: Secondary | ICD-10-CM | POA: Insufficient documentation

## 2013-04-19 DIAGNOSIS — Z8774 Personal history of (corrected) congenital malformations of heart and circulatory system: Secondary | ICD-10-CM | POA: Insufficient documentation

## 2013-04-19 DIAGNOSIS — Y9301 Activity, walking, marching and hiking: Secondary | ICD-10-CM | POA: Insufficient documentation

## 2013-04-19 DIAGNOSIS — I10 Essential (primary) hypertension: Secondary | ICD-10-CM | POA: Insufficient documentation

## 2013-04-19 DIAGNOSIS — Z79899 Other long term (current) drug therapy: Secondary | ICD-10-CM | POA: Insufficient documentation

## 2013-04-19 DIAGNOSIS — S92252A Displaced fracture of navicular [scaphoid] of left foot, initial encounter for closed fracture: Secondary | ICD-10-CM

## 2013-04-19 DIAGNOSIS — Z859 Personal history of malignant neoplasm, unspecified: Secondary | ICD-10-CM | POA: Insufficient documentation

## 2013-04-19 DIAGNOSIS — E079 Disorder of thyroid, unspecified: Secondary | ICD-10-CM | POA: Insufficient documentation

## 2013-04-19 DIAGNOSIS — E119 Type 2 diabetes mellitus without complications: Secondary | ICD-10-CM | POA: Insufficient documentation

## 2013-04-19 DIAGNOSIS — S92253A Displaced fracture of navicular [scaphoid] of unspecified foot, initial encounter for closed fracture: Secondary | ICD-10-CM | POA: Insufficient documentation

## 2013-04-19 DIAGNOSIS — IMO0002 Reserved for concepts with insufficient information to code with codable children: Secondary | ICD-10-CM | POA: Insufficient documentation

## 2013-04-19 DIAGNOSIS — W010XXA Fall on same level from slipping, tripping and stumbling without subsequent striking against object, initial encounter: Secondary | ICD-10-CM | POA: Insufficient documentation

## 2013-04-19 DIAGNOSIS — Z792 Long term (current) use of antibiotics: Secondary | ICD-10-CM | POA: Insufficient documentation

## 2013-04-19 NOTE — ED Notes (Signed)
I applied short leg posterior splint with stirrup, I padded with webril and secured with kerlix, then ace wrap. I fit and adjusted crutches and helped train in use. Patient escorted to car with wheelchair. Patient had difficult time in getting into her auto.

## 2013-04-19 NOTE — ED Provider Notes (Signed)
Medical screening examination/treatment/procedure(s) were performed by non-physician practitioner and as supervising physician I was immediately available for consultation/collaboration.  Martha K Linker, MD 04/19/13 2337 

## 2013-04-19 NOTE — ED Notes (Signed)
Informed it is ok to depart once crutches complete

## 2013-04-19 NOTE — ED Provider Notes (Signed)
History     CSN: [id number]  Arrival date & time 04/19/13  1721   First MD Initiated Contact with Patient 04/19/13 1839      Chief Complaint  Patient presents with  . Foot Injury    (Consider location/radiation/quality/duration/timing/severity/associated sxs/prior treatment) Patient is a 52 y.o. female presenting with foot injury. The history is provided by the patient. No language interpreter was used.  Foot Injury Location:  Foot Time since incident:  3 hours Injury: yes   Mechanism of injury: fall   Fall:    Fall occurred:  Tripped   Height of fall:  Ground level   Impact surface:  Concrete Pain details:    Quality:  Aching and throbbing   Radiates to:  Does not radiate Chronicity:  New Dislocation: no   Prior injury to area:  No Worsened by:  Bearing weight Patient states she tripped on steps after flip-flop got caught while walking.  Patient reporting left lateral ankle pain and mid-foot pain medially.  Past Medical History  Diagnosis Date  . Aorta coarctation   . Thyroid disease   . Diabetes mellitus without complication   . Pituitary tumor   . Hypertension     Past Surgical History  Procedure Laterality Date  . Aorta surgery    . Abdominal hysterectomy    . Cholecystectomy    . Cesarean section      Family History  Problem Relation Age of Onset  . Allergy (severe) Mother   . Heart disease Mother   . Rheum arthritis Mother   . Breast cancer Mother   . COPD Mother   . Heart disease Father   . Rheum arthritis Father   . Asthma Brother     History  Substance Use Topics  . Smoking status: Former Smoker -- 0.50 packs/day for 30 years    Types: Cigarettes    Quit date: 11/28/1978  . Smokeless tobacco: Not on file     Comment: pt. smoke 1 pack a month  . Alcohol Use: 1.2 oz/week    2 Glasses of wine per week    OB History   Grav Para Term Preterm Abortions TAB SAB Ect Mult Living                  Review of Systems  Musculoskeletal:  Positive for arthralgias and gait problem.  All other systems reviewed and are negative.    Allergies  Codeine; Topamax; Celexa; Serzone; and Trazodone and nefazodone  Home Medications   Current Outpatient Rx  Name  Route  Sig  Dispense  Refill  . metoprolol succinate (TOPROL-XL) 25 MG 24 hr tablet   Oral   Take 25 mg by mouth daily.         Marland Kitchen albuterol (PROAIR HFA) 108 (90 BASE) MCG/ACT inhaler   Inhalation   Inhale 2 puffs into the lungs every 6 (six) hours as needed for wheezing or shortness of breath.         . ALPRAZolam (XANAX) 0.5 MG tablet   Oral   Take 0.5 mg by mouth at bedtime as needed for sleep.         . cabergoline (DOSTINEX) 0.5 MG tablet   Oral   Take 0.25 mg by mouth once a week.          . clarithromycin (BIAXIN) 500 MG tablet   Oral   Take 500 mg by mouth 2 (two) times daily.         Marland Kitchen  cyclobenzaprine (FLEXERIL) 10 MG tablet   Oral   Take 10 mg by mouth at bedtime.          . fluticasone (FLONASE) 50 MCG/ACT nasal spray   Nasal   Place 2 sprays into the nose daily.         . furosemide (LASIX) 40 MG tablet   Oral   Take 40 mg by mouth daily as needed.          . gabapentin (NEURONTIN) 100 MG capsule   Oral   Take 1 capsule (100 mg total) by mouth 3 (three) times daily.   90 capsule   11   . ibuprofen (ADVIL,MOTRIN) 800 MG tablet   Oral   Take 800 mg by mouth every 8 (eight) hours as needed for pain.         Marland Kitchen levothyroxine (SYNTHROID, LEVOTHROID) 88 MCG tablet   Oral   Take 88 mcg by mouth daily.         Marland Kitchen loratadine (CLARITIN) 10 MG tablet   Oral   Take 10 mg by mouth daily.         . meperidine (DEMEROL) 50 MG tablet   Oral   Take 1 tablet (50 mg total) by mouth every 4 (four) hours as needed for pain.   30 tablet   0   . nebivolol (BYSTOLIC) 5 MG tablet   Oral   Take 1 tablet (5 mg total) by mouth daily.   30 tablet   0   . omeprazole (PRILOSEC) 40 MG capsule   Oral   Take 1 capsule (40 mg total)  by mouth daily. Take 30-60 minutes before the first meal of the day   90 capsule   3   . predniSONE (STERAPRED UNI-PAK) 10 MG tablet      Prednisone 10 mg take  4 each am x 3 days,  3 x 3 days,  2 each am x 3 days,  1 each am x 3days and stop   30 tablet   0   . promethazine (PHENERGAN) 12.5 MG tablet   Oral   Take 12.5 mg by mouth every 6 (six) hours as needed for nausea.         . ranitidine (ZANTAC) 150 MG tablet      Take 2 at bedime         . traMADol (ULTRAM) 50 MG tablet   Oral   Take 1 tablet (50 mg total) by mouth every 6 (six) hours as needed for pain.   40 tablet   0     BP 147/96  Pulse 87  Temp(Src) 97.8 F (36.6 C) (Oral)  Resp 22  SpO2 97%  Physical Exam  Nursing note and vitals reviewed. Constitutional: She is oriented to person, place, and time. She appears well-developed and well-nourished.  HENT:  Head: Normocephalic.  Eyes: Conjunctivae are normal. Pupils are equal, round, and reactive to light.  Neck: Normal range of motion. Neck supple.  Cardiovascular: Normal rate and regular rhythm.   Pulmonary/Chest: Effort normal and breath sounds normal.  Abdominal: Soft. Bowel sounds are normal.  Musculoskeletal: She exhibits tenderness. She exhibits no edema.       Feet:  Neurological: She is alert and oriented to person, place, and time.  Skin: Skin is warm and dry.  Psychiatric: She has a normal mood and affect. Her behavior is normal. Judgment and thought content normal.    ED Course  Procedures (including critical care time)  Labs Reviewed - No data to display Dg Ankle Complete Left  04/19/2013   *RADIOLOGY REPORT*  Clinical Data: Injury  LEFT ANKLE COMPLETE - 3+ VIEW  Comparison: None.  Findings: No acute fracture.  No dislocation.  Chronic changes are noted and were described on the foot study.  IMPRESSION: No acute bony pathology.   Original Report Authenticated By: Jolaine Click, M.D.   Dg Foot Complete Left  04/19/2013   *RADIOLOGY  REPORT*  Clinical Data: Fall  LEFT FOOT - COMPLETE 3+ VIEW  Comparison: None.  Findings: Small bony density at the posterior and medial navicular bone with an irregular margin adjacent to the remainder of the navicular bone is present.  This is most consistent with a fracture of indeterminate age.  Small bony density adjacent to the dorsal cuneiforms has a chronic appearance.  Minimal spurring at the inferior calcaneus.  Mild degenerative changes in the ankle joint.  IMPRESSION: Navicular fracture of indeterminate age.  Chronic changes.   Original Report Authenticated By: Jolaine Click, M.D.     1. Navicular fracture, foot, left, closed, initial encounter     Splint, crutches, ortho follow-up.  MDM          Jimmye Norman, NP 04/19/13 2235

## 2013-04-19 NOTE — ED Notes (Signed)
Left foot injury earlier today when her flip flop got caught on the step and she fell.

## 2013-10-03 ENCOUNTER — Other Ambulatory Visit: Payer: Self-pay

## 2014-01-18 IMAGING — CR DG FOOT COMPLETE 3+V*L*
3 series · 3 of 3 positions shown · non-contrast
Comparison: None.

CLINICAL DATA: Fall

LEFT FOOT - COMPLETE 3+ VIEW

[t foot ap left]
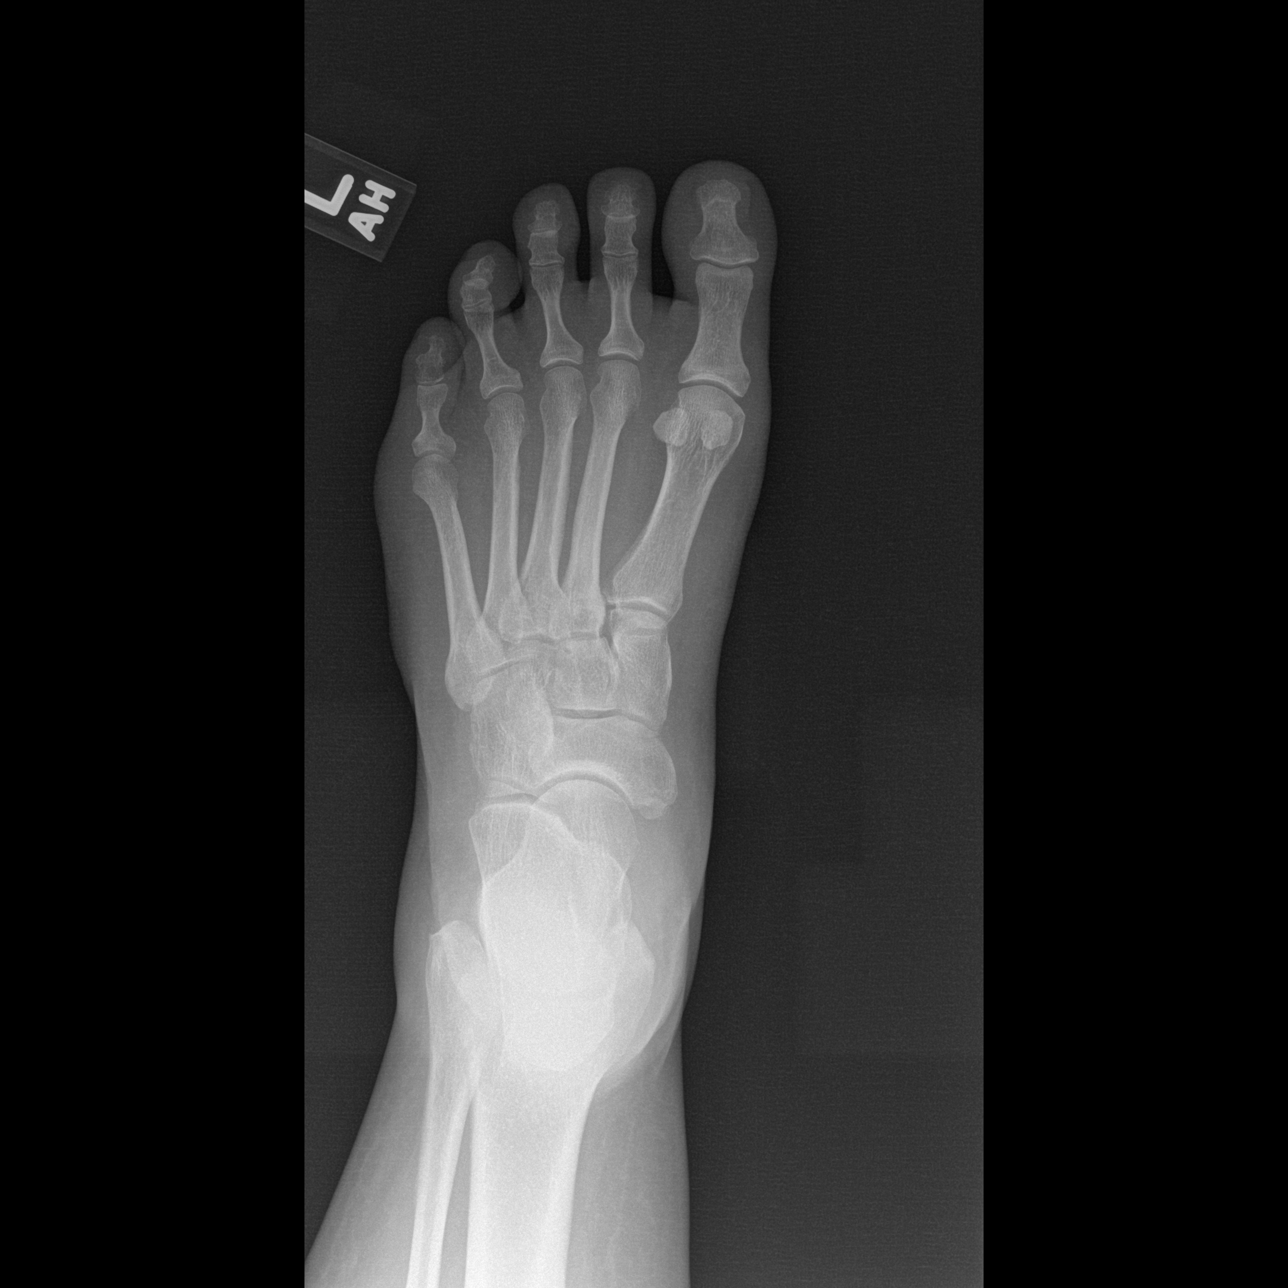

[t foot oblique left]
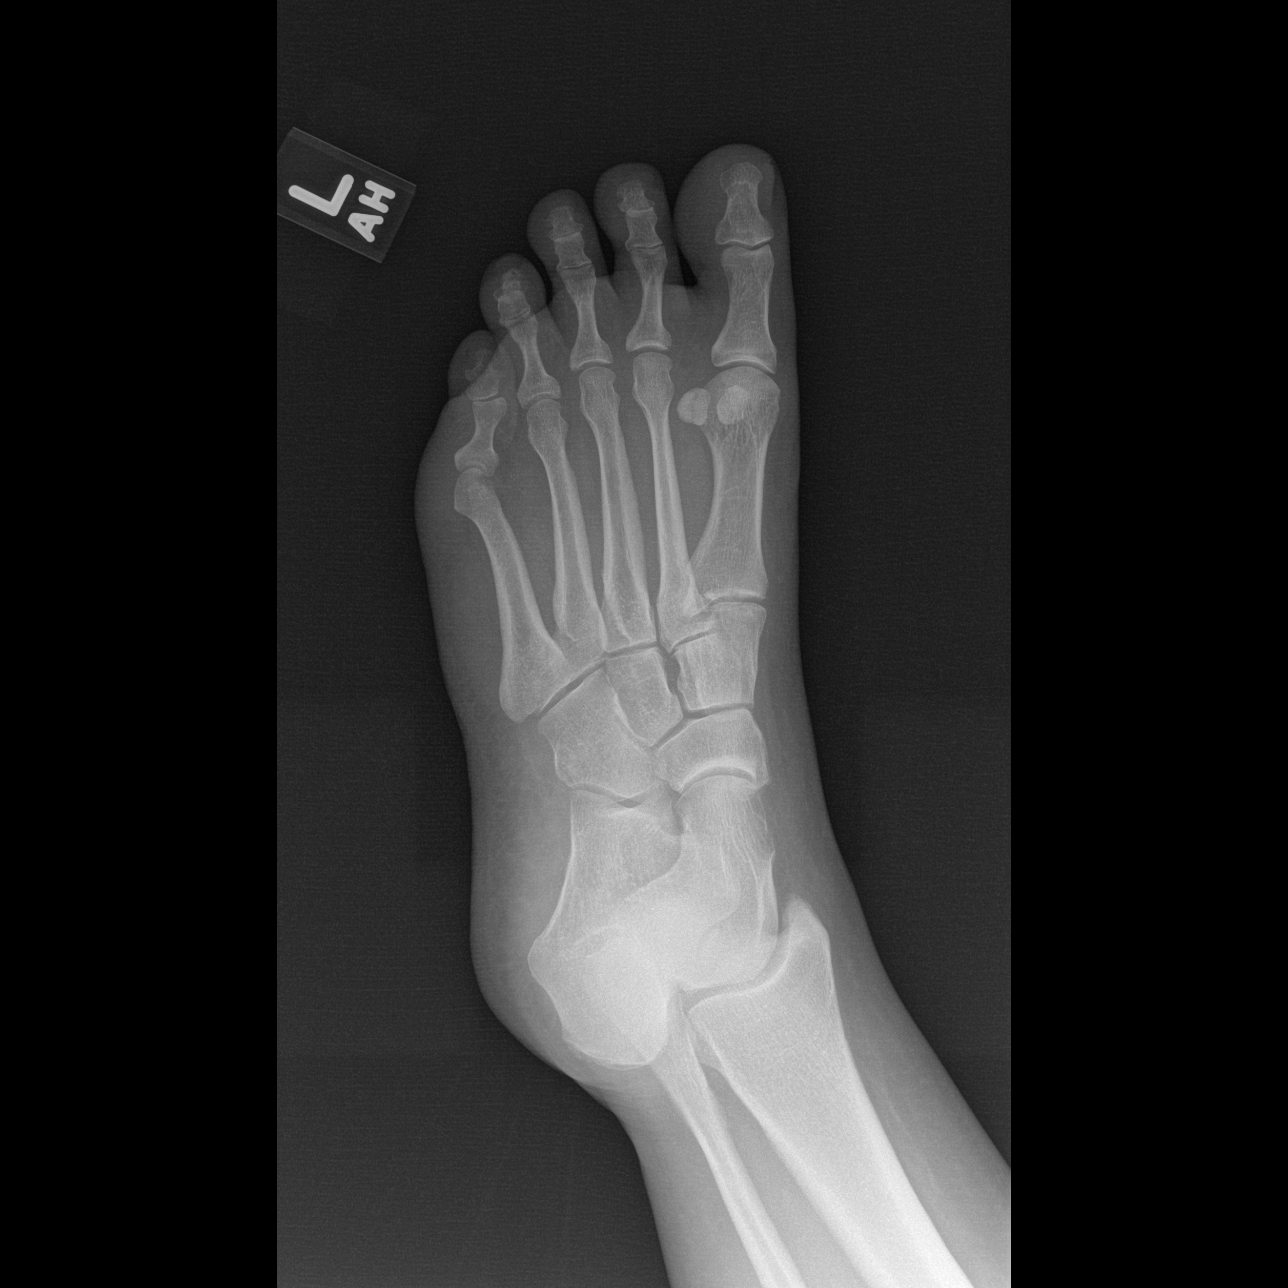

[t foot lat left]
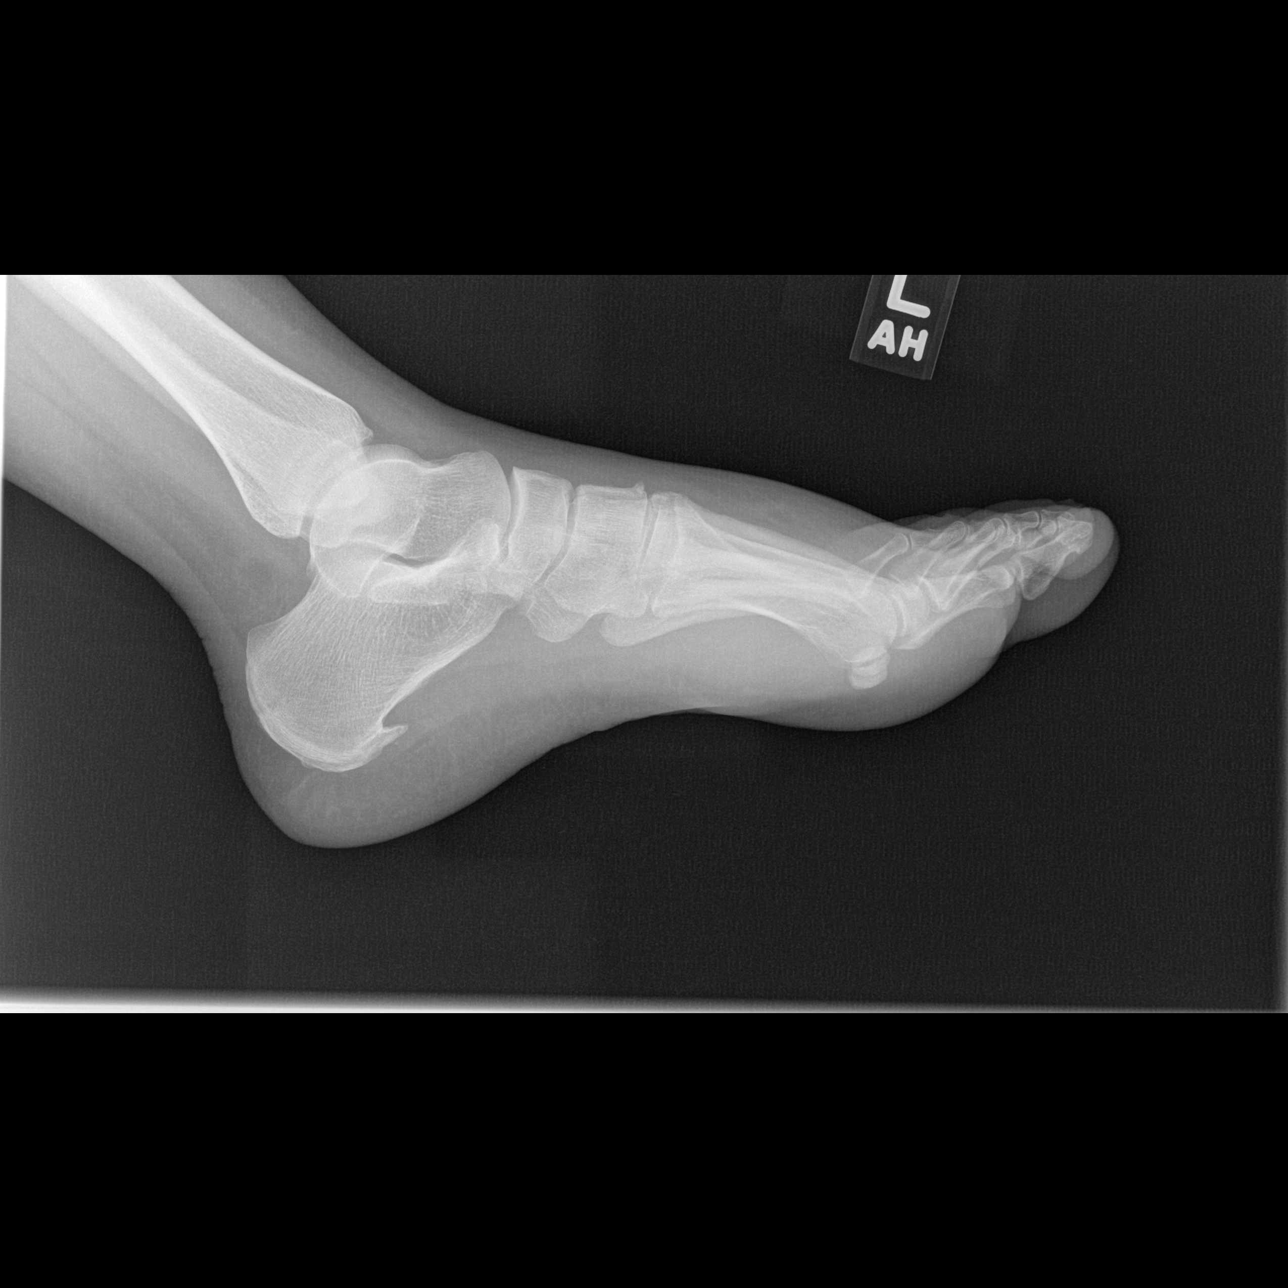

[3 of 3 positions shown; findings below may reference images not displayed]

FINDINGS: Small bony density at the posterior and medial navicular
bone with an irregular margin adjacent to the remainder of the
navicular bone is present.  This is most consistent with a fracture
of indeterminate age.  Small bony density adjacent to the dorsal
cuneiforms has a chronic appearance.  Minimal spurring at the
inferior calcaneus.  Mild degenerative changes in the ankle joint.
IMPRESSION: Navicular fracture of indeterminate age.  Chronic changes.

## 2020-09-20 ENCOUNTER — Emergency Department (INDEPENDENT_AMBULATORY_CARE_PROVIDER_SITE_OTHER)
Admission: EM | Admit: 2020-09-20 | Discharge: 2020-09-20 | Disposition: A | Discharge status: Home / Self Care | Payer: Medicare HMO | Source: Home / Self Care | Attending: Family Medicine | Admitting: Family Medicine

## 2020-09-20 ENCOUNTER — Other Ambulatory Visit: Payer: Self-pay

## 2020-09-20 DIAGNOSIS — N3000 Acute cystitis without hematuria: Secondary | ICD-10-CM

## 2020-09-20 DIAGNOSIS — R350 Frequency of micturition: Secondary | ICD-10-CM

## 2020-09-20 LAB — POCT URINALYSIS DIP (MANUAL ENTRY)
Bilirubin, UA: NEGATIVE
Blood, UA: NEGATIVE
Glucose, UA: NEGATIVE mg/dL
Ketones, POC UA: NEGATIVE mg/dL
Nitrite, UA: NEGATIVE
Protein Ur, POC: NEGATIVE mg/dL
Spec Grav, UA: 1.025 (ref 1.010–1.025)
Urobilinogen, UA: 0.2 E.U./dL
pH, UA: 5.5 (ref 5.0–8.0)

## 2020-09-20 MED ORDER — NITROFURANTOIN MONOHYD MACRO 100 MG PO CAPS: ORAL_CAPSULE | ORAL | 0 refills | Status: AC

## 2020-09-20 NOTE — Discharge Instructions (Signed)
Increase fluid intake. °If symptoms become significantly worse during the night or over the weekend, proceed to the local emergency room.  °

## 2020-09-20 NOTE — ED Triage Notes (Signed)
Pt states that she has some lower back pain, urinary frequency, and urine has a odor. Pt states that the back pain radiates to her lower side. x1 day

## 2020-09-20 NOTE — ED Provider Notes (Signed)
[facility]    CSN: [id number] Arrival date & time: 09/20/20  1523      History   Chief Complaint Chief Complaint  Patient presents with   Back Pain    HPI Monica Peterson is a 59 y.o. female.   Yesterday while at work patient developed urinary frequency, with malodorous and dark urine.  She recalls that she briefly had a low grade fever two days ago.  She denies abdominal pain and fevers, chills, and sweats but has had vague lower back ache.  The history is provided by the patient.  Urinary Frequency This is a new problem. The current episode started yesterday. The problem occurs constantly. The problem has not changed since onset.Pertinent negatives include no abdominal pain. Nothing aggravates the symptoms. Nothing relieves the symptoms. She has tried nothing for the symptoms.    Past Medical History:  Diagnosis Date   Aorta coarctation    Diabetes mellitus without complication ([place])    Hypertension    Pituitary tumor    Thyroid disease     Patient Active Problem List   Diagnosis Date Noted   Urinary frequency 09/20/2020   HBP (high blood pressure) 03/07/2013   Cough 02/20/2013    Past Surgical History:  Procedure Laterality Date   ABDOMINAL HYSTERECTOMY     AORTA SURGERY     CESAREAN SECTION     CHOLECYSTECTOMY      OB History   No obstetric history on file.      Home Medications    Prior to Admission medications   Medication Sig Start Date End Date Taking? Authorizing Provider  calcium-vitamin D (OSCAL WITH D) 500-200 MG-UNIT TABS tablet Take 1 tablet by mouth daily.   Yes [provider]  cycloSPORINE (RESTASIS) 0.05 % ophthalmic emulsion Place 1 drop into both eyes 2 (two) times daily. 01/02/19  Yes [provider]  dexlansoprazole (DEXILANT) 60 MG capsule Take 1 tablet by mouth daily. 07/01/15  Yes [provider]  famotidine (PEPCID) 20 MG tablet Take by mouth. 04/02/19 10/18/20 Yes [provider]  ferrous sulfate 325 (65 FE) MG EC tablet Take by mouth.   Yes [provider]  fluticasone (FLONASE) 50 MCG/ACT nasal spray Place 2 sprays into the nose daily.   Yes [provider]  levothyroxine (SYNTHROID, LEVOTHROID) 88 MCG tablet Take 88 mcg by mouth daily.   Yes [provider]  loratadine (CLARITIN) 10 MG tablet Take 10 mg by mouth daily.   Yes [provider]  magnesium oxide (MAG-OX) 400 MG tablet Take by mouth.   Yes [provider]  Multiple Vitamins-Minerals (MULTIVITAMIN WITH MINERALS) tablet Take 1 tablet by mouth daily.   Yes [provider]  pediatric multivitamin-iron (POLY-VI-SOL WITH IRON) 15 MG chewable tablet Chew by mouth.   Yes [provider]  tiZANidine (ZANAFLEX) 2 MG tablet Take by mouth. 08/27/20  Yes [provider]  vitamin E 1000 UNIT capsule Take 1 capsule by mouth daily. 05/21/18  Yes [provider]  albuterol (PROAIR HFA) 108 (90 BASE) MCG/ACT inhaler Inhale 2 puffs into the lungs every 6 (six) hours as needed for wheezing or shortness of breath.    [provider]  ALPRAZolam Duanne Moron) 0.5 MG tablet Take 0.5 mg by mouth at bedtime as needed for sleep.    [provider]  cabergoline (DOSTINEX) 0.5 MG tablet Take 0.25 mg by mouth once a week.     [provider]  clarithromycin (BIAXIN) 500  MG tablet Take 500 mg by mouth 2 (two) times daily.    [provider]  cyclobenzaprine (FLEXERIL) 10 MG tablet Take 10 mg by mouth at bedtime.     [provider]  furosemide (LASIX) 40 MG tablet Take 40 mg by mouth daily as needed.     [provider]  gabapentin (NEURONTIN) 100 MG capsule Take 1 capsule (100 mg total) by mouth 3 (three) times daily. 03/21/13   Tanda Rockers, MD  ibuprofen (ADVIL,MOTRIN) 800 MG tablet Take 800 mg by mouth every 8 (eight) hours as needed for pain.    [provider]  meperidine (DEMEROL) 50 MG tablet  Take 1 tablet (50 mg total) by mouth every 4 (four) hours as needed for pain. 03/21/13   Tanda Rockers, MD  metoprolol succinate (TOPROL-XL) 25 MG 24 hr tablet Take 25 mg by mouth daily.    [provider]  nebivolol (BYSTOLIC) 5 MG tablet Take 1 tablet (5 mg total) by mouth daily. 03/07/13   Tanda Rockers, MD  nitrofurantoin, macrocrystal-monohydrate, (MACROBID) 100 MG capsule Take one cap PO Q12hr with food. 09/20/20   Kandra Nicolas, MD  omeprazole (PRILOSEC) 40 MG capsule Take 1 capsule (40 mg total) by mouth daily. Take 30-60 minutes before the first meal of the day 03/22/13   Tanda Rockers, MD  predniSONE (STERAPRED UNI-PAK) 10 MG tablet Prednisone 10 mg take  4 each am x 3 days,  3 x 3 days,  2 each am x 3 days,  1 each am x 3days and stop 03/21/13   Tanda Rockers, MD  promethazine (PHENERGAN) 12.5 MG tablet Take 12.5 mg by mouth every 6 (six) hours as needed for nausea.    [provider]  ranitidine (ZANTAC) 150 MG tablet Take 2 at bedime 02/18/13   Tanda Rockers, MD  traMADol (ULTRAM) 50 MG tablet Take 1 tablet (50 mg total) by mouth every 6 (six) hours as needed for pain. 03/21/13   Tanda Rockers, MD    Family History Family History  Problem Relation Age of Onset   Allergy (severe) Mother    Heart disease Mother    Rheum arthritis Mother    Breast cancer Mother    COPD Mother    Heart disease Father    Rheum arthritis Father    Asthma Brother     Social History Social History   Tobacco Use   Smoking status: Former Smoker    Packs/day: 0.50    Years: 30.00    Pack years: 15.00    Types: Cigarettes    Quit date: 11/28/1978    Years since quitting: 41.8   Tobacco comment: pt. smoke 1 pack a month  Substance Use Topics   Alcohol use: Yes    Alcohol/week: 2.0 standard drinks    Types: 2 Glasses of wine per week   Drug use: No     Allergies   Codeine, Topamax [topiramate], Celexa [citalopram hydrobromide], Serzone [nefazodone], and  Trazodone and nefazodone   Review of Systems Review of Systems  Constitutional: Negative for activity change, appetite change, chills, diaphoresis, fatigue and fever.  Gastrointestinal: Negative for abdominal pain.  Endocrine: Positive for cold intolerance.  Genitourinary: Positive for dysuria, frequency and urgency. Negative for flank pain and hematuria.  All other systems reviewed and are negative.    Physical Exam Triage Vital Signs ED Triage Vitals  Enc Vitals Group     BP 09/20/20 1607 137/78  Pulse Rate 09/20/20 1607 76     Resp --      Temp 09/20/20 1607 99 F (37.2 C)     Temp Source 09/20/20 1607 Oral     SpO2 09/20/20 1607 95 %     Weight 09/20/20 1557 140 lb (63.5 kg)     Height 09/20/20 1557 5\' 1"  (1.549 m)     Head Circumference --      Peak Flow --      Pain Score 09/20/20 1556 6     Pain Loc --      Pain Edu? --      Excl. in [place]? --    No data found.  Updated Vital Signs BP 137/78 (BP Location: Right Arm)    Pulse 76    Temp 99 F (37.2 C) (Oral)    Ht 5\' 1"  (1.549 m)    Wt 63.5 kg    SpO2 95%    BMI 26.45 kg/m   Visual Acuity Right Eye Distance:   Left Eye Distance:   Bilateral Distance:    Right Eye Near:   Left Eye Near:    Bilateral Near:     Physical Exam Nursing notes and Vital Signs reviewed. Appearance:  Patient appears stated age, and in no acute distress.    Eyes:  Pupils are equal, round, and reactive to light and accomodation.  Extraocular movement is intact.  Conjunctivae are not inflamed   Pharynx:  Normal; moist mucous membranes  Neck:  Supple.  No adenopathy Lungs:  Clear to auscultation.  Breath sounds are equal.  Moving air well. Heart:  Regular rate and rhythm without murmurs, rubs, or gallops.  Abdomen:  Mild tenderness over bladder without masses or hepatosplenomegaly.  Bowel sounds are present.  No CVA or flank tenderness.  Extremities:  No edema.  Skin:  No rash present.     UC Treatments / Results  Labs (all labs  ordered are listed, but only abnormal results are displayed) Labs Reviewed  POCT URINALYSIS DIP (MANUAL ENTRY) - Abnormal; Notable for the following components:      Result Value   Leukocytes, UA Small (1+) (*)    All other components within normal limits  URINE CULTURE    EKG   Radiology No results found.  Procedures Procedures (including critical care time)  Medications Ordered in UC Medications - No data to display  Initial Impression / Assessment and Plan / UC Course  I have reviewed the triage vital signs and the nursing notes.  Pertinent labs & imaging results that were available during my care of the patient were reviewed by me and considered in my medical decision making (see chart for details).    Urine culture pending. Begin Macrobid. Followup with Family Doctor if not improved in about 8 days.   Final Clinical Impressions(s) / UC Diagnoses   Final diagnoses:  Urinary frequency  Acute cystitis without hematuria     Discharge Instructions     Increase fluid intake.  If symptoms become significantly worse during the night or over the weekend, proceed to the local emergency room.    ED Prescriptions    Medication Sig Dispense Auth. Provider   nitrofurantoin, macrocrystal-monohydrate, (MACROBID) 100 MG capsule Take one cap PO Q12hr with food. 14 capsule Kandra Nicolas, MD        Kandra Nicolas, MD 09/21/20 [phone]

## 2020-09-23 LAB — URINE CULTURE
MICRO NUMBER:: 11113484
SPECIMEN QUALITY:: ADEQUATE
